# Patient Record
Sex: Male | Born: 1980 | Hispanic: No | Marital: Single
Health system: Southern US, Community
[De-identification: ages and names within clinical notes are randomized; demographics above are authoritative.]

---

## 2004-06-13 ENCOUNTER — Encounter: Admission: RE | Admit: 2004-06-13 | Discharge: 2004-06-13 | Payer: Self-pay | Admitting: Specialist

## 2006-05-19 ENCOUNTER — Emergency Department (HOSPITAL_COMMUNITY): Admission: EM | Admit: 2006-05-19 | Discharge: 2006-05-19 | Payer: Self-pay | Admitting: Emergency Medicine

## 2010-09-10 ENCOUNTER — Emergency Department (HOSPITAL_COMMUNITY)
Admission: EM | Admit: 2010-09-10 | Discharge: 2010-09-10 | Payer: Self-pay | Source: Home / Self Care | Admitting: Emergency Medicine

## 2010-11-04 ENCOUNTER — Encounter
Admission: RE | Admit: 2010-11-04 | Discharge: 2010-11-04 | Payer: Self-pay | Source: Home / Self Care | Attending: Specialist | Admitting: Specialist

## 2020-01-19 ENCOUNTER — Emergency Department (HOSPITAL_COMMUNITY)
Admission: EM | Admit: 2020-01-19 | Discharge: 2020-01-19 | Disposition: A | Discharge status: Home / Self Care | Payer: 59 | Attending: Emergency Medicine | Admitting: Emergency Medicine

## 2020-01-19 ENCOUNTER — Emergency Department (HOSPITAL_COMMUNITY): Payer: 59

## 2020-01-19 ENCOUNTER — Encounter (HOSPITAL_COMMUNITY): Payer: Self-pay | Admitting: Student

## 2020-01-19 DIAGNOSIS — R0789 Other chest pain: Secondary | ICD-10-CM | POA: Insufficient documentation

## 2020-01-19 DIAGNOSIS — R079 Chest pain, unspecified: Secondary | ICD-10-CM

## 2020-01-19 LAB — BASIC METABOLIC PANEL
Anion gap: 8 (ref 5–15)
BUN: 12 mg/dL (ref 6–20)
CO2: 27 mmol/L (ref 22–32)
Calcium: 9.3 mg/dL (ref 8.9–10.3)
Chloride: 104 mmol/L (ref 98–111)
Creatinine, Ser: 1.07 mg/dL (ref 0.61–1.24)
GFR calc Af Amer: >60 mL/min (ref >60)
GFR calc non Af Amer: >60 mL/min (ref >60)
Glucose, Bld: 102 mg/dL — ABNORMAL HIGH (ref 70–99)
Potassium: 4 mmol/L (ref 3.5–5.1)
Sodium: 139 mmol/L (ref 135–145)

## 2020-01-19 LAB — CBC
HCT: 45.5 % (ref 39.0–52.0)
Hemoglobin: 14.7 g/dL (ref 13.0–17.0)
MCH: 28.7 pg (ref 26.0–34.0)
MCHC: 32.3 g/dL (ref 30.0–36.0)
MCV: 88.7 fL (ref 80.0–100.0)
Platelets: 238 10*3/uL (ref 150–400)
RBC: 5.13 MIL/uL (ref 4.22–5.81)
RDW: 12.6 % (ref 11.5–15.5)
WBC: 5 10*3/uL (ref 4.0–10.5)
nRBC: 0 % (ref 0.0–0.2)

## 2020-01-19 LAB — HEPATIC FUNCTION PANEL
ALT: 19 U/L (ref 0–44)
AST: 18 U/L (ref 15–41)
Albumin: 3.9 g/dL (ref 3.5–5.0)
Alkaline Phosphatase: 41 U/L (ref 38–126)
Bilirubin, Direct: <0.1 mg/dL (ref 0.0–0.2)
Total Bilirubin: 0.7 mg/dL (ref 0.3–1.2)
Total Protein: 6.8 g/dL (ref 6.5–8.1)

## 2020-01-19 LAB — TROPONIN I (HIGH SENSITIVITY)
Troponin I (High Sensitivity): <2 ng/L (ref <18)
Troponin I (High Sensitivity): 3 ng/L (ref <18)

## 2020-01-19 LAB — LIPASE, BLOOD: Lipase: 29 U/L (ref 11–51)

## 2020-01-19 MED ORDER — LIDOCAINE VISCOUS HCL 2 % MT SOLN
15.0000 mL | Freq: Once | OROMUCOSAL | Status: AC
  Administered 2020-01-19: 15 mL via ORAL
  Filled 2020-01-19: qty 15

## 2020-01-19 MED ORDER — SODIUM CHLORIDE 0.9% FLUSH: 3.0000 mL | Freq: Once | INTRAVENOUS | Status: DC

## 2020-01-19 MED ORDER — ALUM & MAG HYDROXIDE-SIMETH 200-200-20 MG/5ML PO SUSP
30.0000 mL | Freq: Once | ORAL | Status: AC
  Administered 2020-01-19: 15:00:00 30 mL via ORAL
  Filled 2020-01-19: qty 30

## 2020-01-19 MED ORDER — SUCRALFATE 1 GM/10ML PO SUSP: 1.0000 g | Freq: Three times a day (TID) | ORAL | 0 refills | Status: AC

## 2020-01-19 MED ORDER — OMEPRAZOLE 20 MG PO CPDR: 20.0000 mg | DELAYED_RELEASE_CAPSULE | Freq: Every day | ORAL | 0 refills | Status: AC

## 2020-01-19 NOTE — ED Triage Notes (Signed)
Pt arrives pov with c/o of CP that has been ongoing and pt states was seen recently for same but found no cause. Pt denies any sob, fever or cough.

## 2020-01-19 NOTE — ED Notes (Signed)
Patient verbalizes understanding of discharge instructions . Opportunity for questions and answers were provided . Armband removed by staff ,Pt discharged from ED. W/C  offered at D/C  and Declined W/C at D/C and was escorted to lobby by RN.  

## 2020-01-19 NOTE — ED Provider Notes (Signed)
[facility] EMERGENCY DEPARTMENT Provider Note   CSN: [id number] Arrival date & time: 01/19/20  1023     History Chief Complaint  Patient presents with  . Chest Pain    Mitchell Richards is a 39 y.o. male without significant past medical hx who presents to the ED with complaints of intermittent chest pain x 1 year. Patient states that the pain is to the diffuse bilateral chest & sharp. Seem to worsen over the past 1 week and be in the chest as well as in the upper abdomen. Pain is intermittent, worse after eating, no alleviating factors. Has some associated belching with the pain after eating. Tried tums in the past without much relief. Does eat a great deal of spicy foods.. Denies fever, chills, dyspnea, diaphoresis, cough, nausea, vomiting, or diarrhea.   HPI     No past medical history on file.  There are no problems to display for this patient.   History reviewed. No pertinent surgical history.     No family history on file.  Social History   Tobacco Use  . Smoking status: Not on file  Substance Use Topics  . Alcohol use: Not on file  . Drug use: Not on file    Home Medications Prior to Admission medications   Not on File    Allergies    Patient has no allergy information on record.  Review of Systems   Review of Systems  Constitutional: Negative for chills and fever.  Respiratory: Negative for cough and shortness of breath.   Cardiovascular: Positive for chest pain.  Gastrointestinal: Positive for abdominal pain. Negative for blood in stool, constipation, diarrhea, nausea and vomiting.  Genitourinary: Negative for dysuria.  All other systems reviewed and are negative.   Physical Exam Updated Vital Signs BP 111/76 (BP Location: Left Arm)   Pulse 62   Temp 98 F (36.7 C) (Oral)   Resp 12   SpO2 99%   Physical Exam Vitals and nursing note reviewed.  Constitutional:      General: He is not in acute distress.    Appearance: He  is well-developed. He is not toxic-appearing.  HENT:     Head: Normocephalic and atraumatic.  Eyes:     General:        Right eye: No discharge.        Left eye: No discharge.     Conjunctiva/sclera: Conjunctivae normal.  Cardiovascular:     Rate and Rhythm: Normal rate and regular rhythm.     Pulses:          Radial pulses are 2+ on the right side and 2+ on the left side.  Pulmonary:     Effort: Pulmonary effort is normal. No respiratory distress.     Breath sounds: Normal breath sounds. No wheezing, rhonchi or rales.  Abdominal:     General: There is no distension.     Palpations: Abdomen is soft.     Tenderness: There is abdominal tenderness (mild) in the epigastric area. There is no guarding or rebound. Negative signs include Murphy's sign and McBurney's sign.  Musculoskeletal:     Cervical back: Neck supple.     Right lower leg: No tenderness. No edema.     Left lower leg: No tenderness. No edema.  Skin:    General: Skin is warm and dry.     Findings: No rash.  Neurological:     Mental Status: He is alert.     Comments: Clear speech.  Psychiatric:        Behavior: Behavior normal.     ED Results / Procedures / Treatments   Labs (all labs ordered are listed, but only abnormal results are displayed) Labs Reviewed  BASIC METABOLIC PANEL - Abnormal; Notable for the following components:      Result Value   Glucose, Bld 102 (*)    All other components within normal limits  CBC  LIPASE, BLOOD  HEPATIC FUNCTION PANEL  TROPONIN I (HIGH SENSITIVITY)  TROPONIN I (HIGH SENSITIVITY)    EKG EKG Interpretation  Date/Time:  Monday January 19 2020 10:24:39 EST Ventricular Rate:  86 PR Interval:  146 QRS Duration: 82 QT Interval:  340 QTC Calculation: 406 R Axis:   88 Text Interpretation: Normal sinus rhythm Normal ECG No old tracing to compare Confirmed by Deno Etienne [phone]) on 01/19/2020 6:21:25 PM   Radiology DG Chest 2 View  Result Date: 01/19/2020 CLINICAL DATA:   Chest pain EXAM: CHEST - 2 VIEW COMPARISON:  11/04/2010 FINDINGS: Heart and mediastinal contours are within normal limits. No focal opacities or effusions. No acute bony abnormality. IMPRESSION: No active cardiopulmonary disease. Electronically Signed   By: Rolm Baptise M.D.   On: 01/19/2020 11:00    Procedures Procedures (including critical care time)  Medications Ordered in ED Medications  sodium chloride flush (NS) 0.9 % injection 3 mL (has no administration in time range)    ED Course  I have reviewed the triage vital signs and the nursing notes.  Pertinent labs & imaging results that were available during my care of the patient were reviewed by me and considered in my medical decision making (see chart for details).    Mitchell Richards was evaluated in Emergency Department on 01/19/2020 for the symptoms described in the history of present illness. He/she was evaluated in the context of the global COVID-19 pandemic, which necessitated consideration that the patient might be at risk for infection with the SARS-CoV-2 virus that causes COVID-19. Institutional protocols and algorithms that pertain to the evaluation of patients at risk for COVID-19 are in a state of rapid change based on information released by regulatory bodies including the CDC and federal and state organizations. These policies and algorithms were followed during the patient's care in the ED.  MDM Rules/Calculators/A&P                      Patient presents to the emergency department with intermittent chest pain over the past 1 year worse over the past week with upper abdominal discomfort as well.  Patient nontoxic appearing, in no apparent distress, vitals without significant abnormality. Fairly benign physical exam, mild epigastric abdominal tenderness without peritoneal signs.  DDX: ACS, pulmonary embolism, dissection, pneumothorax, pneumonia, arrhythmia, severe anemia, MSK, GERD, PUD, pancreatitis, cholecystitis,  anxiety.  CBC: No anemia or leukocytosis.  BMP: No electrolyte derangement.  Troponin: < 2 , 3- no significant elevation.  Lipase: WNL Hepatic function panel: WNL EKG: NSR, no STEMI.  CXR:  Negative, without infiltrate, effusion, pneumothorax, or fracture/dislocation- personally interpreted & am in agreement with radiologist impression.   Low HEAR score- EKG without obvious acute ischemia, delta troponin without significant elevation, doubt ACS. Patient is low risk wells, doubt pulmonary embolism. Pain is not a tearing sensation, symmetric pulses, no widening of mediastinum on CXR, doubt dissection.  Abdomen remains nontender without peritoneal signs, doubt pancreatitis, cholecystitis, appendicitis, perforation, or obstruction.  Patient feeling much better after GI cocktail, tolerating p.o., will discharge home with  Carafate and omeprazole with PCP follow-up. I discussed results, treatment plan, need for PCP follow-up, and return precautions with the patient. Provided opportunity for questions, patient confirmed understanding and is in agreement with plan.   Final Clinical Impression(s) / ED Diagnoses Final diagnoses:  Chest pain, unspecified type    Rx / DC Orders ED Discharge Orders         Ordered    omeprazole (PRILOSEC) 20 MG capsule  Daily     01/19/20 1843    sucralfate (CARAFATE) 1 GM/10ML suspension  3 times daily with meals & bedtime     01/19/20 1843           Cherly Anderson, PA-C 01/19/20 1844    Raeford Razor, MD 01/20/20 [phone]

## 2020-01-19 NOTE — Discharge Instructions (Addendum)
You were seen in the emergency department today for chest pain. Your work-up in the emergency department has been overall reassuring. Your labs have been fairly normal and or similar to previous blood work you have had done. Your EKG and the enzyme we use to check your heart did not show an acute heart attack at this time. Your chest x-ray was normal.   We are sending you home with the following medicines to help with your symptoms: -Omeprazole: Take each morning prior to meals to help with stomach acidity -Carafate: Take prior to meals and prior to bedtime to help with stomach acidity/discomfort.  We have prescribed you new medication(s) today. Discuss the medications prescribed today with your pharmacist as they can have adverse effects and interactions with your other medicines including over the counter and prescribed medications. Seek medical evaluation if you start to experience new or abnormal symptoms after taking one of these medicines, seek care immediately if you start to experience difficulty breathing, feeling of your throat closing, facial swelling, or rash as these could be indications of a more serious allergic reaction  Please follow attached diet guidelines.  We would like you to follow up closely with your primary care provider within 3 days. Return to the ER immediately should you experience any new or worsening symptoms including but not limited to return of pain, worsened pain, vomiting, shortness of breath, dizziness, lightheadedness, passing out, or any other concerns that you may have.

## 2021-05-22 IMAGING — DX DG CHEST 2V
2 series · 2 of 2 positions shown · non-contrast
Comparison: 11/04/2010

CLINICAL DATA: Chest pain

EXAM:
CHEST - 2 VIEW

[chest pa]
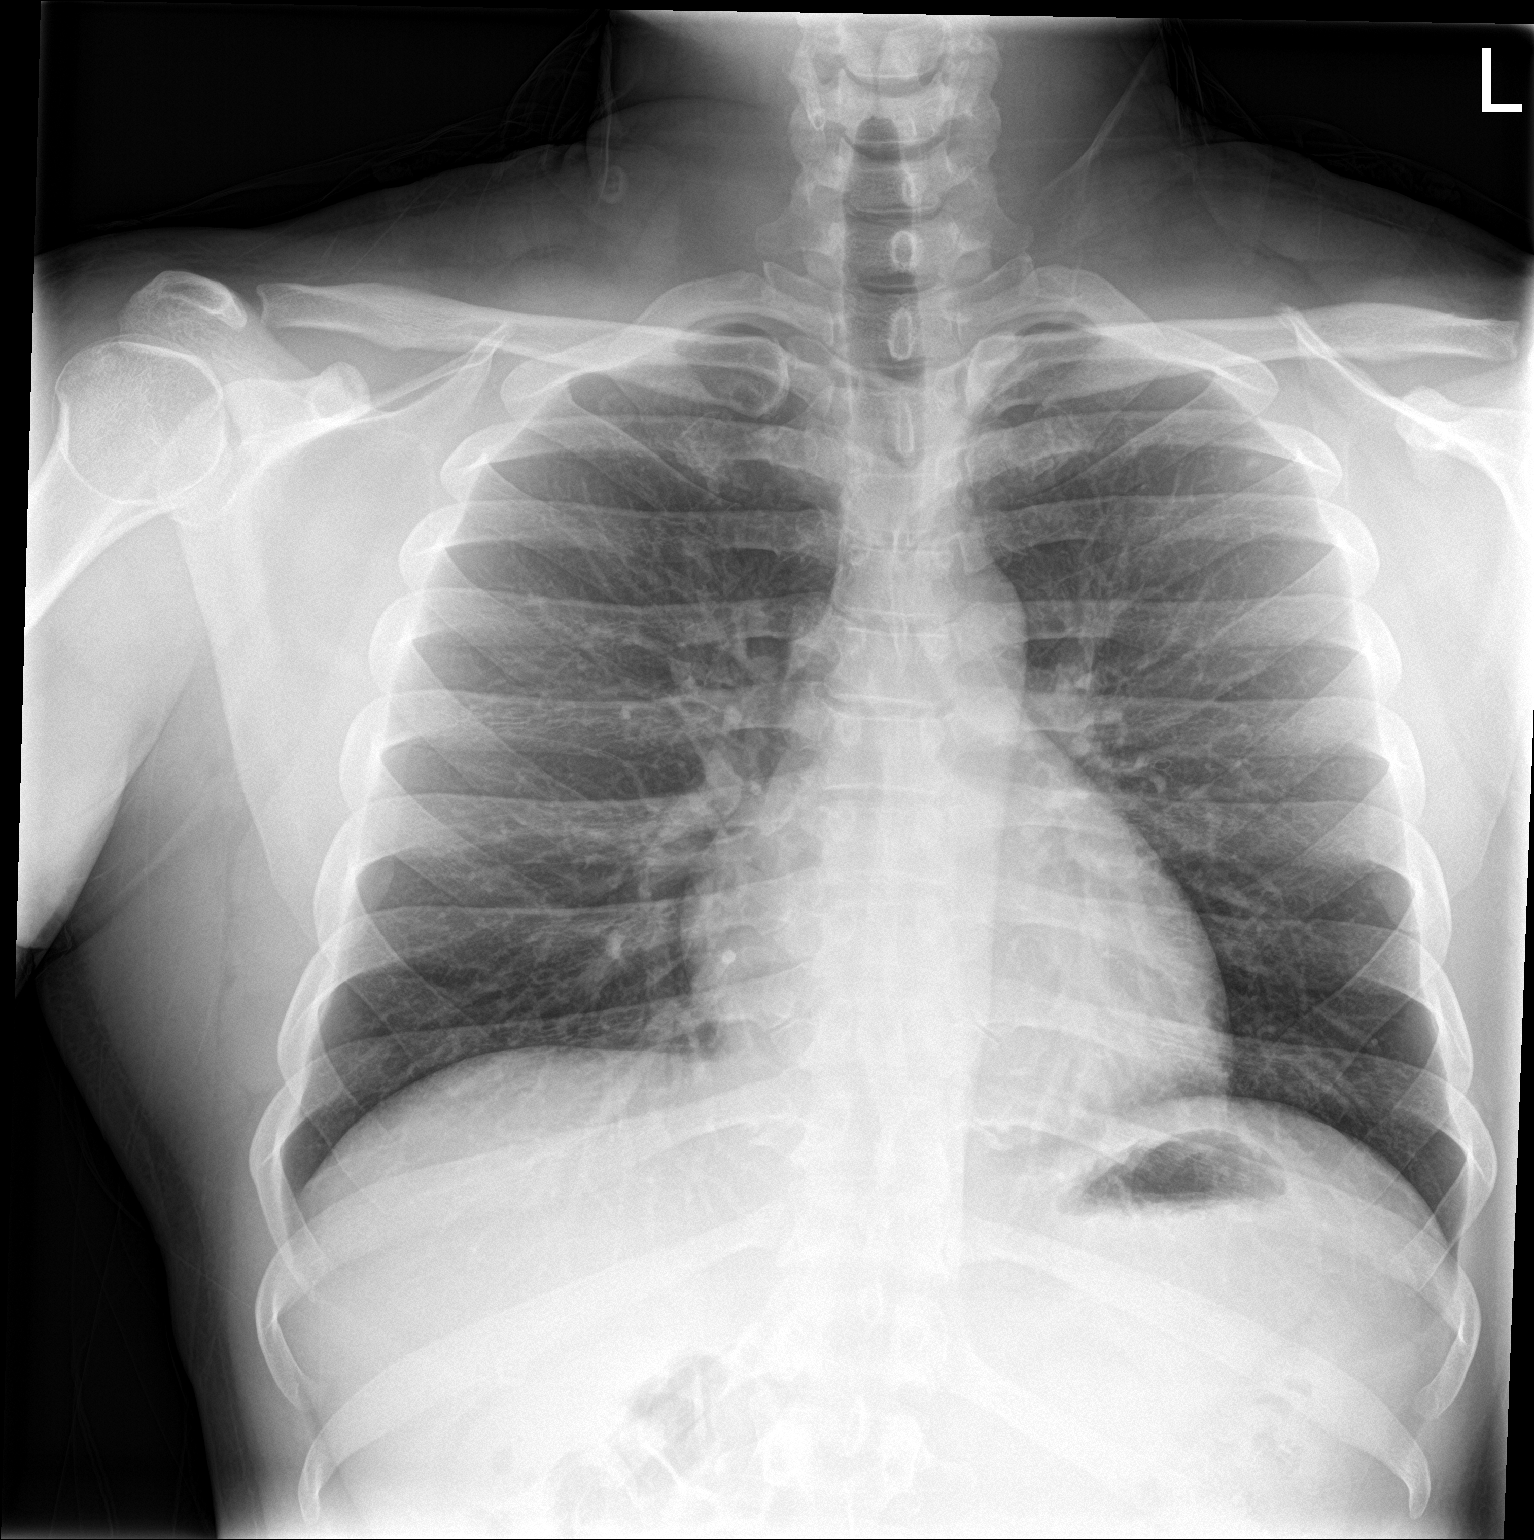

[chest lat]
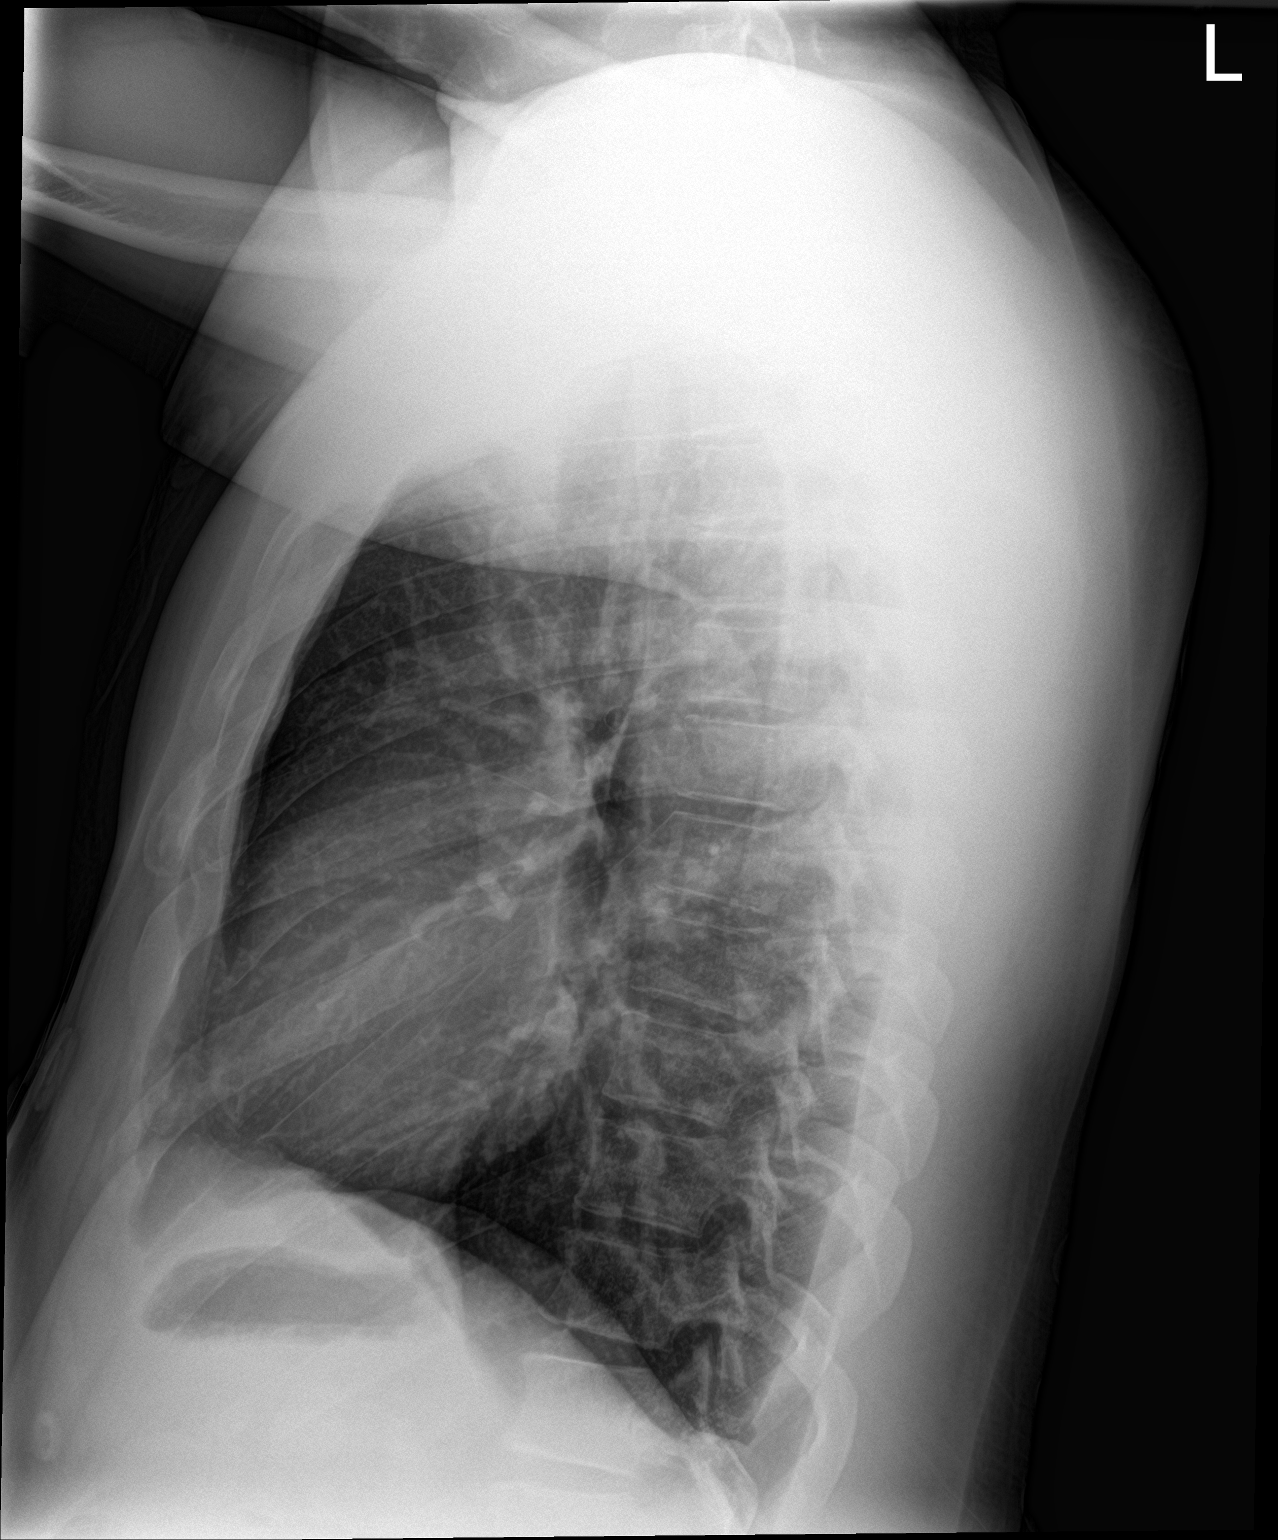

[2 of 2 positions shown; findings below may reference images not displayed]

FINDINGS: Heart and mediastinal contours are within normal limits. No focal
opacities or effusions. No acute bony abnormality.
IMPRESSION: No active cardiopulmonary disease.
